# Patient Record
Sex: Male | Born: 2000 | Race: Black or African American | Hispanic: No | Marital: Single | Smoking: Never smoker
Health system: Southern US, Community
[De-identification: ages and names within clinical notes are randomized; demographics above are authoritative.]

## PROBLEM LIST (undated history)

## (undated) DIAGNOSIS — Z789 Other specified health status: Secondary | ICD-10-CM

---
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
## 2020-03-07 ENCOUNTER — Emergency Department (HOSPITAL_COMMUNITY): Payer: Medicaid Other

## 2020-03-07 ENCOUNTER — Other Ambulatory Visit: Payer: Self-pay

## 2020-03-07 ENCOUNTER — Emergency Department (HOSPITAL_COMMUNITY)
Admission: EM | Admit: 2020-03-07 | Discharge: 2020-03-07 | Disposition: A | Discharge status: Home / Self Care | Payer: Medicaid Other | Attending: Emergency Medicine | Admitting: Emergency Medicine

## 2020-03-07 ENCOUNTER — Encounter (HOSPITAL_COMMUNITY): Payer: Self-pay | Admitting: Emergency Medicine

## 2020-03-07 DIAGNOSIS — Y9289 Other specified places as the place of occurrence of the external cause: Secondary | ICD-10-CM | POA: Diagnosis not present

## 2020-03-07 DIAGNOSIS — Y998 Other external cause status: Secondary | ICD-10-CM | POA: Insufficient documentation

## 2020-03-07 DIAGNOSIS — S0191XA Laceration without foreign body of unspecified part of head, initial encounter: Secondary | ICD-10-CM | POA: Diagnosis present

## 2020-03-07 DIAGNOSIS — Z23 Encounter for immunization: Secondary | ICD-10-CM | POA: Insufficient documentation

## 2020-03-07 DIAGNOSIS — F1290 Cannabis use, unspecified, uncomplicated: Secondary | ICD-10-CM | POA: Insufficient documentation

## 2020-03-07 DIAGNOSIS — S90512A Abrasion, left ankle, initial encounter: Secondary | ICD-10-CM | POA: Diagnosis not present

## 2020-03-07 DIAGNOSIS — Y9389 Activity, other specified: Secondary | ICD-10-CM | POA: Insufficient documentation

## 2020-03-07 MED ORDER — TETANUS-DIPHTH-ACELL PERTUSSIS 5-2.5-18.5 LF-MCG/0.5 IM SUSP
0.5000 mL | Freq: Once | INTRAMUSCULAR | Status: AC
  Administered 2020-03-07: 0.5 mL via INTRAMUSCULAR
  Filled 2020-03-07: qty 0.5

## 2020-03-07 NOTE — ED Notes (Signed)
Patient transported to x-ray. ?

## 2020-03-07 NOTE — ED Notes (Signed)
AVS reviewed with pt who verbalized understanding. PT ambulatory out of dept °
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
## 2021-01-17 ENCOUNTER — Ambulatory Visit (HOSPITAL_COMMUNITY)
Admission: EM | Admit: 2021-01-17 | Discharge: 2021-01-17 | Disposition: A | Discharge status: Home / Self Care | Payer: Medicaid Other | Attending: Physician Assistant | Admitting: Physician Assistant

## 2021-01-17 ENCOUNTER — Encounter (HOSPITAL_COMMUNITY): Payer: Self-pay

## 2021-01-17 DIAGNOSIS — J039 Acute tonsillitis, unspecified: Secondary | ICD-10-CM | POA: Diagnosis not present

## 2021-01-17 DIAGNOSIS — J029 Acute pharyngitis, unspecified: Secondary | ICD-10-CM | POA: Insufficient documentation

## 2021-01-17 LAB — POCT INFECTIOUS MONO SCREEN, ED / UC: Mono Screen: NEGATIVE

## 2021-01-17 LAB — POCT RAPID STREP A, ED / UC: Streptococcus, Group A Screen (Direct): NEGATIVE

## 2021-01-17 MED ORDER — AMOXICILLIN-POT CLAVULANATE 875-125 MG PO TABS: 1.0000 | ORAL_TABLET | Freq: Two times a day (BID) | ORAL | 0 refills | Status: AC

## 2021-01-17 NOTE — ED Provider Notes (Signed)
[facility]    CSN: [id number] Arrival date & time: 01/17/21  1320      History   Chief Complaint Chief Complaint  Patient presents with   Sore Throat    HPI Manuel Blackburn is a 20 y.o. male.   Patient presents today with a 3-day history of sore throat.  Reports pain is rated 7 on a 0-10 pain scale, localized to posterior oropharynx without radiation, described as sharp, worse with swallowing, no alleviating factors identified.  Patient is able to eat and drink normally despite symptoms.  He denies associated fever, cough, nausea, vomiting, body aches, congestion, muffled voice.  He has tried over-the-counter medication without improvement of symptoms.  Denies any known sick contacts.  Denies any recent antibiotic use.  He denies history of asthma, COPD, allergies.  He denies previous tonsillectomy.  Denies any muffled voice, dysphagia, high fever.   Past Medical History:  Diagnosis Date   Medical history non-contributory     Patient Active Problem List   Diagnosis Date Noted   Benzodiazepine abuse (HCC)    Marijuana abuse    Ingestion of foreign substance 12/01/2017    History reviewed. No pertinent surgical history.     Home Medications    Prior to Admission medications   Medication Sig Start Date End Date Taking? Authorizing Provider  amoxicillin-clavulanate (AUGMENTIN) 875-125 MG tablet Take 1 tablet by mouth every 12 (twelve) hours. 01/17/21  Yes Sriyan Cutting, Noberto Retort, PA-C    Family History History reviewed. No pertinent family history.  Social History Social History   Tobacco Use   Smoking status: Never   Smokeless tobacco: Never  Vaping Use   Vaping Use: Never used  Substance Use Topics   Alcohol use: Never   Drug use: Yes    Types: Benzodiazepines, Marijuana     Allergies   Patient has no known allergies.   Review of Systems Review of Systems  Constitutional:  Negative for activity change, appetite change, fatigue and fever.   HENT:  Positive for sore throat and trouble swallowing. Negative for congestion, sinus pressure, sinus pain, sneezing and voice change.   Respiratory:  Negative for cough and shortness of breath.   Cardiovascular:  Negative for chest pain.  Gastrointestinal:  Negative for abdominal pain, diarrhea, nausea and vomiting.  Musculoskeletal:  Negative for arthralgias and myalgias.  Neurological:  Negative for dizziness, light-headedness and headaches.    Physical Exam Triage Vital Signs ED Triage Vitals  Enc Vitals Group     BP 01/17/21 1428 137/82     Pulse Rate 01/17/21 1427 81     Resp 01/17/21 1427 17     Temp 01/17/21 1427 98.9 F (37.2 C)     Temp Source 01/17/21 1427 Oral     SpO2 01/17/21 1427 99 %     Weight --      Height --      Head Circumference --      Peak Flow --      Pain Score 01/17/21 1426 7     Pain Loc --      Pain Edu? --      Excl. in GC? --    No data found.  Updated Vital Signs BP 137/82   Pulse 81   Temp 98.9 F (37.2 C) (Oral)   Resp 17   SpO2 99%   Visual Acuity Right Eye Distance:   Left Eye Distance:   Bilateral Distance:    Right Eye Near:  Left Eye Near:    Bilateral Near:     Physical Exam Vitals reviewed.  Constitutional:      General: He is awake.     Appearance: Normal appearance. He is normal weight. He is not ill-appearing.     Comments: Very pleasant male appears stated age in no acute distress  HENT:     Head: Normocephalic and atraumatic.     Right Ear: Tympanic membrane, ear canal and external ear normal. Tympanic membrane is not erythematous or bulging.     Left Ear: Tympanic membrane, ear canal and external ear normal. Tympanic membrane is not erythematous or bulging.     Nose: Nose normal.     Mouth/Throat:     Pharynx: Uvula midline. Posterior oropharyngeal erythema present. No oropharyngeal exudate.     Tonsils: Tonsillar exudate present. No tonsillar abscesses. 1+ on the right. 1+ on the left.  Cardiovascular:      Rate and Rhythm: Normal rate and regular rhythm.     Heart sounds: Normal heart sounds, S1 normal and S2 normal. No murmur heard. Pulmonary:     Effort: Pulmonary effort is normal. No accessory muscle usage or respiratory distress.     Breath sounds: Normal breath sounds. No stridor. No wheezing, rhonchi or rales.     Comments: Clear to auscultation bilaterally Abdominal:     General: Bowel sounds are normal.     Palpations: Abdomen is soft.     Tenderness: There is no abdominal tenderness.  Lymphadenopathy:     Head:     Right side of head: No submental, submandibular or tonsillar adenopathy.     Left side of head: No submental, submandibular or tonsillar adenopathy.     Cervical: No cervical adenopathy.  Neurological:     Mental Status: He is alert.  Psychiatric:        Behavior: Behavior is cooperative.     UC Treatments / Results  Labs (all labs ordered are listed, but only abnormal results are displayed) Labs Reviewed  CULTURE, GROUP A STREP [facility])  POCT RAPID STREP A, ED / UC  POCT INFECTIOUS MONO SCREEN, ED / UC    EKG   Radiology No results found.  Procedures Procedures (including critical care time)  Medications Ordered in UC Medications - No data to display  Initial Impression / Assessment and Plan / UC Course  I have reviewed the triage vital signs and the nursing notes.  Pertinent labs & imaging results that were available during my care of the patient were reviewed by me and considered in my medical decision making (see chart for details).      Centor score of 2.  Rapid strep negative in office today.  Mononucleosis screening was negative.  Given exudate on exam patient was prescribed Augmentin to be taken twice a day as needed while throat culture is pending.  If throat culture is negative we will discontinue antibiotics.  Discussed that if monotest is a false negative he can develop a rash with this medication; was instructed to discontinue  medication and be reevaluated immediately if he develops any side effects including rash.  He can use over-the-counter analgesics for pain relief.  Discussed alarm symptoms that warrant emergent evaluation including muffled voice, dysphagia, shortness of breath.  Strict return precautions given to which patient expressed understanding.  Final Clinical Impressions(s) / UC Diagnoses   Final diagnoses:  Acute tonsillitis, unspecified etiology  Pharyngitis, unspecified etiology  Sore throat     Discharge Instructions  Your strep test was negative in clinic but we have sent this off for culture to confirm an infection.  Mono testing was negative.  Given how your throat looks I think it is reasonable to start an antibiotic called in Augmentin twice a day for 7 days.  If your monotest is negative but in reality you have mononucleosis you might get a rash with this medication.  If you develop any rash please stop the medication and be seen immediately.  If you have any difficulty swallowing, difficulty talking, shortness of breath you need to go to the emergency room.  You can use Tylenol and ibuprofen for pain relief.     ED Prescriptions     Medication Sig Dispense Auth. Provider   amoxicillin-clavulanate (AUGMENTIN) 875-125 MG tablet Take 1 tablet by mouth every 12 (twelve) hours. 14 tablet Akash Winski, Noberto Retort, PA-C      PDMP not reviewed this encounter.   Jeani Hawking, PA-C 01/17/21 1539

## 2021-01-17 NOTE — ED Triage Notes (Signed)
Pt c/o sore throat x 3 days

## 2021-01-17 NOTE — Discharge Instructions (Addendum)
Your strep test was negative in clinic but we have sent this off for culture to confirm an infection.  Mono testing was negative.  Given how your throat looks I think it is reasonable to start an antibiotic called in Augmentin twice a day for 7 days.  If your monotest is negative but in reality you have mononucleosis you might get a rash with this medication.  If you develop any rash please stop the medication and be seen immediately.  If you have any difficulty swallowing, difficulty talking, shortness of breath you need to go to the emergency room.  You can use Tylenol and ibuprofen for pain relief.

## 2021-01-19 LAB — CULTURE, GROUP A STREP (THRC)
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
## 2024-07-05 ENCOUNTER — Other Ambulatory Visit: Payer: Self-pay

## 2024-07-05 ENCOUNTER — Emergency Department (HOSPITAL_COMMUNITY)

## 2024-07-05 ENCOUNTER — Emergency Department (HOSPITAL_COMMUNITY)
Admission: EM | Admit: 2024-07-05 | Discharge: 2024-07-05 | Disposition: A | Discharge status: Home / Self Care | Attending: Emergency Medicine | Admitting: Emergency Medicine

## 2024-07-05 DIAGNOSIS — Y249XXA Unspecified firearm discharge, undetermined intent, initial encounter: Secondary | ICD-10-CM

## 2024-07-05 LAB — CBC
HCT: 40.8 % (ref 39.0–52.0)
Hemoglobin: 13.8 g/dL (ref 13.0–17.0)
MCH: 30.9 pg (ref 26.0–34.0)
MCHC: 33.8 g/dL (ref 30.0–36.0)
MCV: 91.3 fL (ref 80.0–100.0)
Platelets: 288 K/uL (ref 150–400)
RBC: 4.47 MIL/uL (ref 4.22–5.81)
RDW: 13.2 % (ref 11.5–15.5)
WBC: 5.5 K/uL (ref 4.0–10.5)
nRBC: 0 % (ref 0.0–0.2)

## 2024-07-05 LAB — I-STAT CHEM 8, ED
BUN: 12 mg/dL (ref 6–20)
Calcium, Ion: 1.07 mmol/L — ABNORMAL LOW (ref 1.15–1.40)
Chloride: 104 mmol/L (ref 98–111)
Creatinine, Ser: 1.1 mg/dL (ref 0.61–1.24)
Glucose, Bld: 147 mg/dL — ABNORMAL HIGH (ref 70–99)
HCT: 42 % (ref 39.0–52.0)
Hemoglobin: 14.3 g/dL (ref 13.0–17.0)
Potassium: 3.6 mmol/L (ref 3.5–5.1)
Sodium: 142 mmol/L (ref 135–145)
TCO2: 26 mmol/L (ref 22–32)

## 2024-07-05 LAB — COMPREHENSIVE METABOLIC PANEL WITH GFR
ALT: 30 U/L (ref 0–44)
AST: 30 U/L (ref 15–41)
Albumin: 3.2 g/dL — ABNORMAL LOW (ref 3.5–5.0)
Alkaline Phosphatase: 43 U/L (ref 38–126)
Anion gap: 8 (ref 5–15)
BUN: 12 mg/dL (ref 6–20)
CO2: 26 mmol/L (ref 22–32)
Calcium: 8.6 mg/dL — ABNORMAL LOW (ref 8.9–10.3)
Chloride: 107 mmol/L (ref 98–111)
Creatinine, Ser: 1.12 mg/dL (ref 0.61–1.24)
GFR, Estimated: >60 mL/min (ref >60)
Glucose, Bld: 156 mg/dL — ABNORMAL HIGH (ref 70–99)
Potassium: 3.8 mmol/L (ref 3.5–5.1)
Sodium: 141 mmol/L (ref 135–145)
Total Bilirubin: 1.8 mg/dL — ABNORMAL HIGH (ref 0.0–1.2)
Total Protein: 5.4 g/dL — ABNORMAL LOW (ref 6.5–8.1)

## 2024-07-05 LAB — SAMPLE TO BLOOD BANK

## 2024-07-05 LAB — PROTIME-INR
INR: 1 (ref 0.8–1.2)
Prothrombin Time: 13.3 s (ref 11.4–15.2)

## 2024-07-05 LAB — ETHANOL: Alcohol, Ethyl (B): <15 mg/dL (ref <15)

## 2024-07-05 LAB — I-STAT CG4 LACTIC ACID, ED: Lactic Acid, Venous: 1.4 mmol/L (ref 0.5–1.9)

## 2024-07-05 MED ORDER — IOHEXOL 350 MG/ML SOLN
100.0000 mL | Freq: Once | INTRAVENOUS | Status: AC | PRN
  Administered 2024-07-05: 100 mL via INTRAVENOUS

## 2024-07-05 MED ORDER — TETANUS-DIPHTH-ACELL PERTUSSIS 5-2-15.5 LF-MCG/0.5 IM SUSP
0.5000 mL | Freq: Once | INTRAMUSCULAR | Status: AC
  Administered 2024-07-05: 0.5 mL via INTRAMUSCULAR
  Filled 2024-07-05: qty 0.5

## 2024-07-05 MED ORDER — LIDOCAINE HCL (PF) 1 % IJ SOLN
5.0000 mL | Freq: Once | INTRAMUSCULAR | Status: AC
  Administered 2024-07-05: 5 mL
  Filled 2024-07-05: qty 5

## 2024-07-05 NOTE — ED Notes (Signed)
 Patient transported to CT
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
## 2024-07-05 NOTE — ED Notes (Signed)
 [employer] present on arrival
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
